# Patient Record
Sex: Female | Born: 1995 | Hispanic: No | Marital: Single | Smoking: Never smoker
Health system: Southern US, Community
[De-identification: ages and names within clinical notes are randomized; demographics above are authoritative.]

## PROBLEM LIST (undated history)

## (undated) HISTORY — PX: TONSILLECTOMY: SUR1361

---
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
## 2015-08-22 ENCOUNTER — Emergency Department (HOSPITAL_COMMUNITY)
Admission: EM | Admit: 2015-08-22 | Discharge: 2015-08-23 | Disposition: A | Discharge status: Home / Self Care | Payer: BLUE CROSS/BLUE SHIELD | Attending: Emergency Medicine | Admitting: Emergency Medicine

## 2015-08-22 ENCOUNTER — Encounter (HOSPITAL_COMMUNITY): Payer: Self-pay | Admitting: Emergency Medicine

## 2015-08-22 DIAGNOSIS — X58XXXA Exposure to other specified factors, initial encounter: Secondary | ICD-10-CM | POA: Insufficient documentation

## 2015-08-22 DIAGNOSIS — Y9389 Activity, other specified: Secondary | ICD-10-CM | POA: Diagnosis not present

## 2015-08-22 DIAGNOSIS — Y998 Other external cause status: Secondary | ICD-10-CM | POA: Diagnosis not present

## 2015-08-22 DIAGNOSIS — T782XXA Anaphylactic shock, unspecified, initial encounter: Secondary | ICD-10-CM

## 2015-08-22 DIAGNOSIS — R112 Nausea with vomiting, unspecified: Secondary | ICD-10-CM | POA: Diagnosis present

## 2015-08-22 DIAGNOSIS — L509 Urticaria, unspecified: Secondary | ICD-10-CM | POA: Insufficient documentation

## 2015-08-22 DIAGNOSIS — Y9289 Other specified places as the place of occurrence of the external cause: Secondary | ICD-10-CM | POA: Diagnosis not present

## 2015-08-22 MED ORDER — SODIUM CHLORIDE 0.9 % IV BOLUS (SEPSIS)
1000.0000 mL | Freq: Once | INTRAVENOUS | Status: AC
  Administered 2015-08-22: 1000 mL via INTRAVENOUS

## 2015-08-22 MED ORDER — FAMOTIDINE IN NACL 20-0.9 MG/50ML-% IV SOLN
20.0000 mg | Freq: Once | INTRAVENOUS | Status: AC
  Administered 2015-08-22: 20 mg via INTRAVENOUS
  Filled 2015-08-22: qty 50

## 2015-08-22 NOTE — ED Provider Notes (Signed)
CSN: [id number]     Arrival date & time 08/22/15  2304 History   First MD Initiated Contact with Patient 08/22/15 2316     No chief complaint on file.    (Consider location/radiation/quality/duration/timing/severity/associated sxs/prior Treatment) HPI   Patient with no significant PMH presents to the ER by EMS after having an allergic reaction to an unknown substance. She ate dinner at [facility] and had some wings and then went home and took some Amoxicillin for the sinus infection that she has. She reports that she has taken Amoxicillin many times in the past with no adverse reactions. 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   She ate dinner at 2000 and at 2100 she started to feel hot, abdominal pain, short of breath, lip and tongue tingling, N/V, shaky, and redness to her entire body with itching. Pain was originally 7/10 and is not mild. EMS gave her Epi IM, 50 mcg Benadryl, 4 mg Zofran. She is feeling much better and says her symptoms have improved greatly. Stable vital signs on initial exam.  History reviewed. No pertinent past medical history. Past Surgical History  Procedure Laterality Date  . Tonsillectomy     No family history on file. Social History  Substance Use Topics  . Smoking status: Never Smoker   . Smokeless tobacco: None  . Alcohol Use: No   OB History    No data available     Review of Systems  Review of Systems All other systems negative except as documented in the HPI. All pertinent positives and negatives as reviewed in the HPI.   Allergies  Review of patient's allergies indicates no known allergies.  Home Medications   Prior to Admission medications   Medication Sig Start Date End Date Taking? Authorizing Provider  diphenhydrAMINE (BENADRYL) 25 MG tablet Take 1 tablet (25 mg total) by mouth every 6 (six) hours. 08/23/15   Gaynelle Pastrana Neva Seat, PA-C  EPINEPHrine 0.3 mg/0.3 mL IJ SOAJ injection Inject  0.3 mLs (0.3 mg total) into the muscle once. 08/23/15   Evelisse Szalkowski Neva Seat, PA-C  loratadine (CLARITIN) 10 MG tablet Take 1 tablet (10 mg total) by mouth daily. 08/23/15   Oskar Cretella Neva Seat, PA-C  predniSONE (DELTASONE) 10 MG tablet Take 2 tablets (20 mg total) by mouth daily. 08/23/15   Raphaella Larkin Neva Seat, PA-C   BP 109/65 mmHg  Pulse 78  Temp(Src) 98.1 F (36.7 C) (Oral)  Resp 20  SpO2 98%  LMP 08/18/2015 Physical Exam  Constitutional: She appears well-developed and well-nourished. No distress.  HENT:  Head: Normocephalic and atraumatic.  Right Ear: Tympanic membrane and ear canal normal.  Left Ear: Tympanic membrane and ear canal normal.  Nose: Nose normal.  Mouth/Throat: Uvula is midline, oropharynx is clear and moist and mucous membranes are normal.  No intra oral swelling  Eyes: EOM and lids are normal. Pupils are equal, round, and reactive to light. Right conjunctiva is injected. Left conjunctiva is injected.  Neck: Normal range of motion. Neck supple.  Cardiovascular: Normal rate and regular rhythm.   Pulmonary/Chest: Effort normal and breath sounds normal. No accessory muscle usage or stridor. No respiratory distress.  Abdominal: Soft. Bowel sounds are normal. There is no tenderness. There is no rigidity and no guarding.  No signs of abdominal distention  Musculoskeletal:  No LE swelling  Neurological: She is alert.  Acting at baseline  Skin: Skin is warm and dry. Rash (diffuse urticarial rash) noted.  Rash is urticarial.  Nursing note and vitals reviewed.   ED Course  Procedures (including critical care time) Labs Review Labs Reviewed - No data to display  Imaging Review No results found. I have personally reviewed and evaluated these images and lab results as part of my medical decision-making.   EKG Interpretation   Date/Time:  Thursday August 22 2015 23:08:27 EDT Ventricular Rate:  88 PR Interval:  143 QRS Duration: 82 QT Interval:  332 QTC Calculation: 402 R Axis:    89 Text Interpretation:  Sinus rhythm Biatrial enlargement No previous  tracing Confirmed by BEATON  MD, ROBERT ([id number]) on 08/22/2015 11:51:38 PM      MDM   Final diagnoses:  Anaphylactic reaction, initial encounter    Patient monitored for 3.5 hours in ED. All of her symptoms resolved. She no long her has any rash, N/V, abdominal pain, injection or the eyes, or itching. She is feeling much better. The patient has had an anaphylactic reaction that responded well to Epi, fluids, benadryl, Zofran, steroids and pepcid  rx for home: diphenhydrAMINE (BENADRYL) 25 MG tablet Take 1 tablet (25 mg total) by mouth every 6 (six) hours. 20 tablet Marlon Peliffany Harless Molinari, PA-C    EPINEPHrine 0.3 mg/0.3 mL IJ SOAJ injection Inject 0.3 mLs (0.3 mg total) into the muscle once. 1 Device Maytal Mijangos, PA-C   loratadine (CLARITIN) 10 MG tablet Take 1 tablet (10 mg total) by mouth daily. 14 tablet Myrella Fahs, PA-C   ondansetron (ZOFRAN) 4 MG tablet Take 1 tablet (4 mg total) by mouth every 6 (six) hours. 12 tablet Marlon Peliffany Valena Ivanov, PA-C   predniSONE (DELTASONE) 10 MG tablet Take 2 tablets (20 mg total) by mouth daily.      Return precautions given. Patient to f/u with PCP to discuss allergy testing. At this time I  Have voiced caution with taking anymore of her Amoxicillin although I doubt this is the culprit because the reaction happened so fast. Will have her discuss with her PCP.  Medications  sodium chloride 0.9 % bolus 1,000 mL (0 mLs Intravenous Stopped 08/23/15 0118)  famotidine (PEPCID) IVPB 20 mg premix (0 mg Intravenous Stopped 08/23/15 0118)     I feel the patient has had an appropriate workup for their chief complaint at this time and likelihood of emergent condition existing is low. Discussed s/sx that warrant return to the ED.  Filed Vitals:   08/23/15 0200 08/23/15 0215  BP: 111/73 109/65  Pulse: 91 78  Temp:    Resp: 16 [address]43 [address]20     Jahziah Simonin, PA-C 08/23/15 0234  Nelva Nayobert Beaton,  MD 08/24/15 1105

## 2015-08-22 NOTE — ED Notes (Signed)
Patient comes from home states she was out to eat at 2000. Around 2100 patient states she started feeling Hot, N/V shaky, and redness noted all over. Patient now complains of ABD pain 7/10 Patient given Epi IM by EMS 50mg  Benadryl  and 4mg  of Zofran. Patient alert and oriented on arrival denies any SOB. Patient noted. To have redness noted to bilateral feet, face, sclera, and chest.

## 2015-08-23 MED ORDER — DIPHENHYDRAMINE HCL 25 MG PO TABS: 25.0000 mg | ORAL_TABLET | Freq: Four times a day (QID) | ORAL | Status: DC

## 2015-08-23 MED ORDER — LORATADINE 10 MG PO TABS: 10.0000 mg | ORAL_TABLET | Freq: Every day | ORAL | Status: DC

## 2015-08-23 MED ORDER — EPINEPHRINE 0.3 MG/0.3ML IJ SOAJ: 0.3000 mg | Freq: Once | INTRAMUSCULAR | Status: AC

## 2015-08-23 MED ORDER — PREDNISONE 20 MG PO TABS
60.0000 mg | ORAL_TABLET | Freq: Once | ORAL | Status: AC
  Administered 2015-08-23: 60 mg via ORAL
  Filled 2015-08-23: qty 3

## 2015-08-23 MED ORDER — METHYLPREDNISOLONE SODIUM SUCC 125 MG IJ SOLR: 125.0000 mg | Freq: Once | INTRAMUSCULAR | Status: DC

## 2015-08-23 MED ORDER — ONDANSETRON HCL 4 MG PO TABS: 4.0000 mg | ORAL_TABLET | Freq: Four times a day (QID) | ORAL | Status: DC

## 2015-08-23 MED ORDER — PREDNISONE 10 MG PO TABS: 20.0000 mg | ORAL_TABLET | Freq: Every day | ORAL | Status: DC

## 2015-08-23 NOTE — Discharge Instructions (Signed)

## 2015-08-23 NOTE — ED Notes (Signed)
Patient given ice pack per request.

## 2018-08-22 ENCOUNTER — Other Ambulatory Visit: Payer: Self-pay

## 2018-08-22 ENCOUNTER — Emergency Department (HOSPITAL_BASED_OUTPATIENT_CLINIC_OR_DEPARTMENT_OTHER)
Admission: EM | Admit: 2018-08-22 | Discharge: 2018-08-22 | Disposition: A | Discharge status: Home / Self Care | Payer: Commercial Managed Care - PPO | Attending: Emergency Medicine | Admitting: Emergency Medicine

## 2018-08-22 ENCOUNTER — Encounter (HOSPITAL_BASED_OUTPATIENT_CLINIC_OR_DEPARTMENT_OTHER): Payer: Self-pay | Admitting: Emergency Medicine

## 2018-08-22 ENCOUNTER — Emergency Department (HOSPITAL_BASED_OUTPATIENT_CLINIC_OR_DEPARTMENT_OTHER): Payer: Commercial Managed Care - PPO

## 2018-08-22 DIAGNOSIS — Z79899 Other long term (current) drug therapy: Secondary | ICD-10-CM | POA: Diagnosis not present

## 2018-08-22 DIAGNOSIS — I82492 Acute embolism and thrombosis of other specified deep vein of left lower extremity: Secondary | ICD-10-CM | POA: Diagnosis not present

## 2018-08-22 DIAGNOSIS — I82402 Acute embolism and thrombosis of unspecified deep veins of left lower extremity: Secondary | ICD-10-CM

## 2018-08-22 DIAGNOSIS — I824Y2 Acute embolism and thrombosis of unspecified deep veins of left proximal lower extremity: Secondary | ICD-10-CM

## 2018-08-22 DIAGNOSIS — M79605 Pain in left leg: Secondary | ICD-10-CM | POA: Diagnosis present

## 2018-08-22 LAB — CBC WITH DIFFERENTIAL/PLATELET
Abs Immature Granulocytes: 0.06 10*3/uL (ref 0.00–0.07)
Basophils Absolute: 0.1 10*3/uL (ref 0.0–0.1)
Basophils Relative: 0 %
Eosinophils Absolute: 0 10*3/uL (ref 0.0–0.5)
Eosinophils Relative: 0 %
HCT: 40.7 % (ref 36.0–46.0)
Hemoglobin: 13.2 g/dL (ref 12.0–15.0)
Immature Granulocytes: 0 %
Lymphocytes Relative: 6 %
Lymphs Abs: 1 10*3/uL (ref 0.7–4.0)
MCH: 28 pg (ref 26.0–34.0)
MCHC: 32.4 g/dL (ref 30.0–36.0)
MCV: 86.2 fL (ref 80.0–100.0)
Monocytes Absolute: 0.9 10*3/uL (ref 0.1–1.0)
Monocytes Relative: 6 %
Neutro Abs: 14.5 10*3/uL — ABNORMAL HIGH (ref 1.7–7.7)
Neutrophils Relative %: 88 %
Platelets: 252 10*3/uL (ref 150–400)
RBC: 4.72 MIL/uL (ref 3.87–5.11)
RDW: 13.3 % (ref 11.5–15.5)
WBC: 16.5 10*3/uL — ABNORMAL HIGH (ref 4.0–10.5)
nRBC: 0 % (ref 0.0–0.2)

## 2018-08-22 LAB — BASIC METABOLIC PANEL
Anion gap: 11 (ref 5–15)
BUN: 12 mg/dL (ref 6–20)
CO2: 20 mmol/L — ABNORMAL LOW (ref 22–32)
Calcium: 9.4 mg/dL (ref 8.9–10.3)
Chloride: 102 mmol/L (ref 98–111)
Creatinine, Ser: 0.89 mg/dL (ref 0.44–1.00)
GFR calc Af Amer: >60 mL/min (ref >60)
GFR calc non Af Amer: >60 mL/min (ref >60)
Glucose, Bld: 123 mg/dL — ABNORMAL HIGH (ref 70–99)
Potassium: 3.8 mmol/L (ref 3.5–5.1)
Sodium: 133 mmol/L — ABNORMAL LOW (ref 135–145)

## 2018-08-22 LAB — PREGNANCY, URINE: Preg Test, Ur: NEGATIVE

## 2018-08-22 MED ORDER — HEPARIN (PORCINE) 25000 UT/250ML-% IV SOLN
1300.0000 [IU]/h | INTRAVENOUS | Status: DC
  Administered 2018-08-22: 12:00:00 1300 [IU]/h via INTRAVENOUS
  Filled 2018-08-22: qty 250

## 2018-08-22 MED ORDER — APIXABAN 5 MG PO TABS: 5.0000 mg | ORAL_TABLET | Freq: Two times a day (BID) | ORAL | 1 refills | Status: AC

## 2018-08-22 MED ORDER — APIXABAN 5 MG PO TABS
10.0000 mg | ORAL_TABLET | Freq: Two times a day (BID) | ORAL | Status: DC
  Administered 2018-08-22: 16:00:00 10 mg via ORAL
  Filled 2018-08-22: qty 2

## 2018-08-22 MED ORDER — HEPARIN BOLUS VIA INFUSION
4000.0000 [IU] | Freq: Once | INTRAVENOUS | Status: AC
  Administered 2018-08-22: 12:00:00 4000 [IU] via INTRAVENOUS

## 2018-08-22 MED ORDER — APIXABAN 5 MG PO TABS: 5.0000 mg | ORAL_TABLET | Freq: Two times a day (BID) | ORAL | Status: DC

## 2018-08-22 NOTE — ED Provider Notes (Signed)
[facility] Emergency Department Provider Note MRN:  [id number]  Arrival date & time: 08/22/18     Chief Complaint   Leg Pain   History of Present Illness   Sherri Walker is a 23 y.o. year-old female with a history of tonsillectomy presenting to the ED with chief complaint of leg pain.  Sudden onset left leg pain that began at 4 AM this morning, constant, moderate to severe, sent here from med [facility] for evaluation.  Denies chest pain or shortness of breath, no exacerbating relieving factors.  Denies fever, no cough, no abdominal pain.  Pain is improved after Toradol.  Review of Systems  A complete 10 system review of systems was obtained and all systems are negative except as noted in the HPI and PMH.   Patient's Health History   History reviewed. No pertinent past medical history.  Past Surgical History:  Procedure Laterality Date  . TONSILLECTOMY      No family history on file.  Social History   Socioeconomic History  . Marital status: Single    Spouse name: Not on file  . Number of children: Not on file  . Years of education: Not on file  . Highest education level: Not on file  Occupational History  . Not on file  Social Needs  . Financial resource strain: Not on file  . Food insecurity:    Worry: Not on file    Inability: Not on file  . Transportation needs:    Medical: Not on file    Non-medical: Not on file  Tobacco Use  . Smoking status: Never Smoker  . Smokeless tobacco: Never Used  Substance and Sexual Activity  . Alcohol use: No  . Drug use: No  . Sexual activity: Not on file  Lifestyle  . Physical activity:    Days per week: Not on file    Minutes per session: Not on file  . Stress: Not on file  Relationships  . Social connections:    Talks on phone: Not on file    Gets together: Not on file    Attends religious service: Not on file    Active member of club or organization: Not on file    Attends meetings of clubs  or organizations: Not on file    Relationship status: Not on file  . Intimate partner violence:    Fear of current or ex partner: Not on file    Emotionally abused: Not on file    Physically abused: Not on file    Forced sexual activity: Not on file  Other Topics Concern  . Not on file  Social History Narrative  . Not on file     Physical Exam  Vital Signs and Nursing Notes reviewed Vitals:   08/22/18 1600 08/22/18 1630  BP: (!) 101/53 112/85  Pulse: 84   Resp: 18 19  Temp:    SpO2: 96%     CONSTITUTIONAL: Well-appearing, NAD NEURO:  Alert and oriented x 3, no focal deficits EYES:  eyes equal and reactive ENT/NECK:  no LAD, no JVD CARDIO: Regular rate, well-perfused, normal S1 and S2 PULM:  CTAB no wheezing or rhonchi GI/GU:  normal bowel sounds, non-distended, non-tender MSK/SPINE:  No gross deformities, no edema SKIN:  no rash, atraumatic PSYCH:  Appropriate speech and behavior  Diagnostic and Interventional Summary    Labs Reviewed  BASIC METABOLIC PANEL - Abnormal; Notable for the following components:      Result  Value   Sodium 133 (*)    CO2 20 (*)    Glucose, Bld 123 (*)    All other components within normal limits  CBC WITH DIFFERENTIAL/PLATELET - Abnormal; Notable for the following components:   WBC 16.5 (*)    Neutro Abs 14.5 (*)    All other components within normal limits  PREGNANCY, URINE  CBC    US Venous Img Lower Unilateral Left  Final Result      Medications  apixaban (ELIQUIS) tablet 10 mg (10 mg Oral Given 08/22/18 1612)    Followed by  apixaban (ELIQUIS) tablet 5 mg (has no administration in time range)  heparin bolus via infusion 4,000 Units (4,000 Units Intravenous Bolus from Bag 08/22/18 1217)     Procedures Critical Care  ED Course and Medical Decision Making  I have reviewed the triage vital signs and the nursing notes.  Pertinent labs & imaging results that were available during my care of the patient were reviewed by me and  considered in my medical decision making (see below for details).  Evaluated by Dr. Darrick PennaFields given the concern at [facility] for phlegmasia cerulea dolens.  Patient's greatly improved based on the description of her pain at [facility].  Currently without pain after Toradol, no objective difference in the clinical appearance both of her legs, neurovascularly intact distally.  Ultrasound does reveal DVT.  Per vascular recommendations, will start oral anticoagulation, pharmacy consulted for counseling, will follow-up with Dr. Darrick Pennafields as needed.  After the discussed management above, the patient was determined to be safe for discharge.  The patient was in agreement with this plan and all questions regarding their care were answered.  ED return precautions were discussed and the patient will return to the ED with any significant worsening of condition.  Elmer SowMichael M. Pilar PlateBero, MD [facility] [email or web address]  Final Clinical Impressions(s) / ED Diagnoses     ICD-10-CM   1. Acute deep vein thrombosis (DVT) of proximal vein of left lower extremity (HCC) I82.4Y2     ED Discharge Orders         Ordered    apixaban (ELIQUIS) 5 MG TABS tablet  2 times daily     08/22/18 1632             Sherri SousBero, Dimetrius Montfort M, MD 08/22/18 [phone]

## 2018-08-22 NOTE — ED Notes (Signed)
Vascular team at bedside

## 2018-08-22 NOTE — ED Notes (Signed)
Pharmacy at bedside, RN to stop heparin upon eliquis arrival.

## 2018-08-22 NOTE — ED Notes (Addendum)
ED Provider at bedside discussing test results and dispo plan of care for admission to [facility].

## 2018-08-22 NOTE — Discharge Instructions (Addendum)
Please call the vascular surgery clinic provided to set up a follow-up appointment.  Information on my medicine - ELIQUIS (apixaban)  This medication education was reviewed with me or my healthcare representative as part of my discharge preparation.  WHY WAS ELIQUIS PRESCRIBED FOR YOU? Eliquis was prescribed to treat blood clots that may have been found in the veins of your legs (deep vein thrombosis) or in your lungs (pulmonary embolism) and to reduce the risk of them occurring again.  WHAT DO YOU NEED TO KNOW ABOUT ELIQUIS ? The starting dose is 10 mg (two 5 mg tablets) taken TWICE daily for the FIRST SEVEN (7) DAYS, then the dose is reduced to ONE 5 mg tablet taken TWICE daily.  Eliquis may be taken with or without food.   Try to take the dose about the same time in the morning and in the evening. If you have difficulty swallowing the tablet whole please discuss with your pharmacist how to take the medication safely.  Take Eliquis exactly as prescribed and DO NOT stop taking Eliquis without talking to the doctor who prescribed the medication.  Stopping may increase your risk of developing a new blood clot.  Refill your prescription before you run out.  After discharge, you should have regular check-up appointments with your healthcare provider that is prescribing your Eliquis.    WHAT DO YOU DO IF YOU MISS A DOSE? If a dose of ELIQUIS is not taken at the scheduled time, take it as soon as possible on the same day and twice-daily administration should be resumed. The dose should not be doubled to make up for a missed dose.  IMPORTANT SAFETY INFORMATION A possible side effect of Eliquis is bleeding. You should call your healthcare provider right away if you experience any of the following: Bleeding from an injury or your nose that does not stop. Unusual colored urine (red or dark brown) or unusual colored stools (red or black). Unusual bruising for unknown reasons. A serious fall  or if you hit your head (even if there is no bleeding).  Some medicines may interact with Eliquis and might increase your risk of bleeding or clotting while on Eliquis. To help avoid this, consult your healthcare provider or pharmacist prior to using any new prescription or non-prescription medications, including herbals, vitamins, non-steroidal anti-inflammatory drugs (NSAIDs) and supplements.  This website has more information on Eliquis (apixaban): http://www.eliquis.com/eliquis/home

## 2018-08-22 NOTE — ED Notes (Signed)
Patient transported to Ultrasound 

## 2018-08-22 NOTE — ED Notes (Signed)
ED Provider at bedside. 

## 2018-08-22 NOTE — ED Provider Notes (Signed)
[facility] EMERGENCY DEPARTMENT Provider Note   CSN: [id number] Arrival date & time: 08/22/18  6381    History   Chief Complaint Chief Complaint  Patient presents with  . Leg Pain    HPI Sherri Walker is a 23 y.o. female.     HPI  23 year old female presents with leg pain.  Over the last 2 days she has been having low back pain.  She has not taken anything for it.  This morning around 4 AM she developed sudden left lower extremity pain.  She feels like her leg is a little swollen and has noticed it is discolored and appears blue.  The pain primarily was in her thigh though also down the rest of her leg.  It feels like it shot down from her back.  Went to urgent care and was given Toradol which has made her discomfort much better but she still has some pain.  The leg feels a little weaker than the right.  She is able to walk but it is painful. No chest pain or shortness of breath.  History reviewed. No pertinent past medical history.  There are no active problems to display for this patient.   Past Surgical History:  Procedure Laterality Date  . TONSILLECTOMY       OB History   No obstetric history on file.      Home Medications    Prior to Admission medications   Medication Sig Start Date End Date Taking? Authorizing Provider  EPINEPHrine 0.3 mg/0.3 mL IJ SOAJ injection Inject 0.3 mLs (0.3 mg total) into the muscle once. 08/23/15   Marlon Pel, PA-C  JUNEL FE 1/20 1-20 MG-MCG tablet TK 1 T PO AT THE SAME TIME QD 07/23/18   [provider]    Family History No family history on file.  Social History Social History   Tobacco Use  . Smoking status: Never Smoker  . Smokeless tobacco: Never Used  Substance Use Topics  . Alcohol use: No  . Drug use: No     Allergies   Amoxicillin   Review of Systems Review of Systems  Constitutional: Negative for fever.  Respiratory: Negative for shortness of breath.   Cardiovascular: Positive for  leg swelling. Negative for chest pain.  Musculoskeletal: Positive for back pain and myalgias.  Neurological: Positive for weakness. Negative for numbness.  All other systems reviewed and are negative.    Physical Exam Updated Vital Signs BP 113/72 (BP Location: Right Arm)   Pulse 84   Temp 98.5 F (36.9 C) (Oral)   Resp 16   Ht 5\' 7"  (1.702 m)   Wt 86.2 kg   LMP 08/22/2018   SpO2 98%   BMI 29.76 kg/m   Physical Exam Vitals signs and nursing note reviewed.  Constitutional:      Appearance: She is well-developed.  HENT:     Head: Normocephalic and atraumatic.     Right Ear: External ear normal.     Left Ear: External ear normal.     Nose: Nose normal.  Eyes:     General:        Right eye: No discharge.        Left eye: No discharge.  Cardiovascular:     Rate and Rhythm: Normal rate and regular rhythm.     Pulses:          Dorsalis pedis pulses are 2+ on the right side and 2+ on the left side.  Pulmonary:  Effort: Pulmonary effort is normal.  Abdominal:     General: There is no distension.     Tenderness: There is no abdominal tenderness.  Musculoskeletal:     Lumbar back: She exhibits tenderness.     Comments: Left lower extremity is diffusely darker and mildly swollen compared to RLE. Strength is 5/5 in RLE. She cannot lift it as high off stretcher in LLE but resists downward pressure without difficulty. Somewhat painful. Normal sensation.  Skin:    General: Skin is warm and dry.  Neurological:     Mental Status: She is alert.  Psychiatric:        Mood and Affect: Mood is not anxious.        ED Treatments / Results  Labs (all labs ordered are listed, but only abnormal results are displayed) Labs Reviewed  BASIC METABOLIC PANEL - Abnormal; Notable for the following components:      Result Value   Sodium 133 (*)    CO2 20 (*)    Glucose, Bld 123 (*)    All other components within normal limits  CBC WITH DIFFERENTIAL/PLATELET - Abnormal; Notable for  the following components:   WBC 16.5 (*)    Neutro Abs 14.5 (*)    All other components within normal limits  PREGNANCY, URINE    EKG None  Radiology US Venous Img Lower Unilateral Left  Result Date: 08/22/2018 CLINICAL DATA:  Left lower extremity pain for 3 days EXAM: LEFT LOWER EXTREMITY VENOUS DUPLEX ULTRASOUND TECHNIQUE: Doppler venous assessment of the left lower extremity deep venous system was performed, including characterization of spectral flow, compressibility, and phasicity. COMPARISON:  None. FINDINGS: There is non compressible occlusive thrombus throughout the left common femoral, femoral, and popliteal veins. There is no obvious superficial vein thrombosis. There is no evidence of DVT in the calf. IMPRESSION: There is extensive DVT throughout the left common femoral, femoral, and popliteal veins. Critical Value/emergent results were called by telephone at the time of interpretation on 08/22/2018 at 11:42 am to Dr. Pricilla Loveless , who verbally acknowledged these results. Electronically Signed   By: Jolaine Click M.D.   On: 08/22/2018 11:42    Procedures .Critical Care Performed by: Pricilla Loveless, MD Authorized by: Pricilla Loveless, MD   Critical care provider statement:    Critical care time (minutes):  30   Critical care time was exclusive of:  Separately billable procedures and treating other patients   Critical care was necessary to treat or prevent imminent or life-threatening deterioration of the following conditions: limb ischemia.   Critical care was time spent personally by me on the following activities:  Development of treatment plan with patient or surrogate, discussions with consultants, evaluation of patient's response to treatment, examination of patient, obtaining history from patient or surrogate, ordering and performing treatments and interventions, ordering and review of laboratory studies, ordering and review of radiographic studies, pulse oximetry,  re-evaluation of patient's condition and review of old charts   (including critical care time)  Medications Ordered in ED Medications  heparin bolus via infusion 4,000 Units (has no administration in time range)  heparin ADULT infusion 100 units/mL (25000 units/210mL sodium chloride 0.45%) (has no administration in time range)     Initial Impression / Assessment and Plan / ED Course  I have reviewed the triage vital signs and the nursing notes.  Pertinent labs & imaging results that were available during my care of the patient were reviewed by me and considered in my medical decision  making (see chart for details).        Patient has strong pulses in both extremities but with the purplish skin color in association with the large DVT this is consistent with phlegmasia cerulea dolens.  No chest symptoms to suggest PE.  She has been started on IV heparin.  I consulted with Dr. Darrick PennaFields of vascular surgery who [facility]  Dr. Patria Maneampos, EDP, aware.  Will keep n.p.o. With rest her leg color has improved in the ED.  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

## 2018-08-22 NOTE — Progress Notes (Signed)
ANTICOAGULATION CONSULT NOTE  Pharmacy Consult for heparin>>apixaban Indication: DVT   Patient Measurements: Height: 5\' 7"  (170.2 cm) Weight: 190 lb (86.2 kg) IBW/kg (Calculated) : 61.6 Heparin Dosing Weight: 80 kg  Vital Signs: Temp: 99 F (37.2 C) (04/20 1432) Temp Source: Oral (04/20 1432) BP: 101/53 (04/20 1600) Pulse Rate: 84 (04/20 1600)  Labs: Recent Labs    08/22/18 1046  HGB 13.2  HCT 40.7  PLT 252  CREATININE 0.89      Assessment: 23 yo female with leg pain. Found to have extensive DVT. Started on IV heparin but now transitioning to apixaban.   Goal of Therapy:  Monitor platelets by anticoagulation protocol: Yes   Plan:  DC heparin Start apixaban 10mg  PO BID x 7 days then 5mg  PO BID thereafter Pt educated on apixaban use  Sherri Walker, Sherri Walker 08/22/2018,4:12 PM

## 2018-08-22 NOTE — Consult Note (Addendum)
Referring Physician: [facility]  Patient name: Sherri Walker MRN: [id number] DOB: 1996/01/21 Sex: female  REASON FOR CONSULT: rule out Phlegmasia Cerulea Dolens  HPI: Sherri ImusOla K Shilling is a 23 y.o. female, with a 1 day history of enlarging swollen painful left leg.  No prior DVT.  No history of hypercoagulable state.  She does not smoke.  She is on oral contraceptives started recently.  Pt was started on heparin drip [facility]  She states leg still feels heavy but feels better than earlier today.  No motor or sensory deficit.   History reviewed. No pertinent past medical history. Past Surgical History:  Procedure Laterality Date  . TONSILLECTOMY      No family history on file.  SOCIAL HISTORY: Social History   Socioeconomic History  . Marital status: Single    Spouse name: Not on file  . Number of children: Not on file  . Years of education: Not on file  . Highest education level: Not on file  Occupational History  . Not on file  Social Needs  . Financial resource strain: Not on file  . Food insecurity:    Worry: Not on file    Inability: Not on file  . Transportation needs:    Medical: Not on file    Non-medical: Not on file  Tobacco Use  . Smoking status: Never Smoker  . Smokeless tobacco: Never Used  Substance and Sexual Activity  . Alcohol use: No  . Drug use: No  . Sexual activity: Not on file  Lifestyle  . Physical activity:    Days per week: Not on file    Minutes per session: Not on file  . Stress: Not on file  Relationships  . Social connections:    Talks on phone: Not on file    Gets together: Not on file    Attends religious service: Not on file    Active member of club or organization: Not on file    Attends meetings of clubs or organizations: Not on file    Relationship status: Not on file  . Intimate partner violence:    Fear of current or ex partner: Not on file    Emotionally abused:  Not on file    Physically abused: Not on file    Forced sexual activity: Not on file  Other Topics Concern  . Not on file  Social History Narrative  . Not on file    Allergies  Allergen Reactions  . Amoxicillin Anaphylaxis    Current Facility-Administered Medications  Medication Dose Route Frequency Provider Last Rate Last Dose  . heparin ADULT infusion 100 units/mL (25000 units/24150mL sodium chloride 0.45%)  1,300 Units/hr Intravenous Continuous Baldemar FridayMasters, Alison M, [facility] 13 mL/hr at 08/22/18 1216 1,300 Units/hr at 08/22/18 1216   Current Outpatient Medications  Medication Sig Dispense Refill  . EPINEPHrine 0.3 mg/0.3 mL IJ SOAJ injection Inject 0.3 mLs (0.3 mg total) into the muscle once. 1 Device 0  . JUNEL FE 1/20 1-20 MG-MCG tablet TK 1 T PO AT THE SAME TIME QD      ROS:   General:  No weight loss, Fever, chills  HEENT: No recent headaches, no nasal bleeding, no visual changes, no sore throat  Neurologic: No dizziness, blackouts, seizures. No recent symptoms of stroke or mini- stroke. No recent episodes of slurred speech, or temporary blindness.  Cardiac: No recent episodes of chest pain/pressure, no shortness of  breath at rest.  No shortness of breath with exertion.  Denies history of atrial fibrillation or irregular heartbeat  Vascular: No history of rest pain in feet.  No history of claudication.  No history of non-healing ulcer, No history of DVT   Pulmonary: No home oxygen, no productive cough, no hemoptysis,  No asthma or wheezing  Musculoskeletal:  [ ]  Arthritis, [ ]  Low back pain,  [ ]  Joint pain  Hematologic:No history of hypercoagulable state.  No history of easy bleeding.  No history of anemia  Gastrointestinal: No hematochezia or melena,  No gastroesophageal reflux, no trouble swallowing  Urinary: [ ]  chronic Kidney disease, [ ]  on HD - [ ]  MWF or [ ]  TTHS, [ ]  Burning with urination, [ ]  Frequent urination, [ ]  Difficulty urinating;   Skin: No rashes   Psychological: No history of anxiety,  No history of depression   Physical Examination  Vitals:   08/22/18 0924 08/22/18 0925 08/22/18 1141 08/22/18 1432  BP: 129/87  113/72 (!) 145/108  Pulse: 95  84 90  Resp: 16  16 16   Temp: 98.5 F (36.9 C)   99 F (37.2 C)  TempSrc: Oral   Oral  SpO2: 96%  98% 99%  Weight:  86.2 kg    Height:  5\' 7"  (1.702 m)      Body mass index is 29.76 kg/m.  General:  Alert and oriented, no acute distress HEENT: Normal Neck: No JVD Cardiac: Regular Rate and Rhythm Skin: No rash, pink warm brisk cap refill toes bilaterally symmetric Extremity Pulses:  2+ radial, brachial, femoral, dorsalis pedis pulses bilaterally Musculoskeletal: No deformity left leg with some edema approximately 5-10 percent larger than right, not tense no evidence of compartment syndrome Neurologic: Upper and lower extremity motor 5/5 and symmetric  DATA:  DVT left common femoral popliteal vein  ASSESSMENT:  Left leg DVT most likely related to oral contraceptive use  PLAN:  1. Would continue IV heparin.  If has persistant or recurrent event down the road would consider further investigation for May-thurner.  At this point minimal symptoms would treat with stopping oral contraceptives and 3 months of oral anticoagulation.  Consider hypercoag workup if future events  No indication for venous thrombolysis at this point.  No follow up from my standpoint  Call if questions   Fabienne Bruns, MD [facility] [place] Office: [phone] Pager: [phone]  Addendum: Called by patient's former primary care physician on August 25, 2018.  This was Dr. Loyola Mast.  She had received a copy of my note regarding the patient.  She has not seen the patient for over 1 year.  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  She also has a family history of this.  In light of this consideration will need to  be given for lifelong anticoagulation.  I discussed this with Dr. Rana Snare.  She is going to try to arrange for the patient to make sure she has follow-up with a primary care physician to follow this long-term.  Fabienne Bruns, MD [facility] [place] Office: [phone] Pager: [phone]

## 2018-08-22 NOTE — Progress Notes (Signed)
ANTICOAGULATION CONSULT NOTE - Initial Consult  Pharmacy Consult for heparin Indication: DVT   Patient Measurements: Height: 5\' 7"  (170.2 cm) Weight: 190 lb (86.2 kg) IBW/kg (Calculated) : 61.6 Heparin Dosing Weight: 80 kg  Vital Signs: Temp: 98.5 F (36.9 C) (04/20 0924) Temp Source: Oral (04/20 0924) BP: 113/72 (04/20 1141) Pulse Rate: 84 (04/20 1141)  Labs: Recent Labs    08/22/18 1046  HGB 13.2  HCT 40.7  PLT 252  CREATININE 0.89      Assessment: 23 yo female with leg pain. Found to have extensive DVT. Starting heparin infusion. CBC and SCr stable and wnl. Dopplers: IMPRESSION: There is extensive DVT throughout the left common femoral, femoral, and popliteal veins.    Goal of Therapy:  Heparin level 0.3-0.7 units/ml Monitor platelets by anticoagulation protocol: Yes   Plan:  -Heparin bolus 4000 units x1 then 1300 units/hr -Daily HL, CBC -Level tonight   Sherri Walker 08/22/2018,11:49 AM

## 2018-08-22 NOTE — ED Notes (Signed)
Pt arrives via [facility], pt denies current pain in LLE, but reports it did start at 0400 this am. Pt comfortable at this time. Will continue to monitor.

## 2018-08-22 NOTE — ED Triage Notes (Signed)
Pain in left low back 2 days ago without known injury and no hx of back pain.  This morning the pain was in her entire left leg but worse in upper leg.  Went to UC and they referred her to ED to r/o blood clot. Received Toradol IM with marked improvement in pain.

## 2018-08-26 ENCOUNTER — Telehealth: Payer: Self-pay | Admitting: *Deleted

## 2018-08-26 ENCOUNTER — Other Ambulatory Visit: Payer: Self-pay | Admitting: Vascular Surgery

## 2018-08-26 MED ORDER — OXYCODONE-ACETAMINOPHEN 5-325 MG PO TABS: 1.0000 | ORAL_TABLET | Freq: Four times a day (QID) | ORAL | 0 refills | Status: AC | PRN

## 2018-08-26 NOTE — Telephone Encounter (Signed)
Patient called c/o leg pain 9/10. No relief with Tylenol.  Denies any swelling or discoloration, leg warm and equal temperature. She is taking Eliquis. Rx for Percocet sent to pharmacy by Dr. Randie Heinz. Patient instructed to take for severe pain only and take the least amount to get relief of pain. Go to [facility] ER for any acute or worsening condition. Verbalized understanding.

## 2019-08-23 IMAGING — US VENOUS DOPPLER ULTRASOUND OF LEFT LOWER EXTREMITY
1 series · 14 of 24 positions shown · non-contrast
Comparison: None.

CLINICAL DATA: Left lower extremity pain for 3 days

EXAM:
LEFT LOWER EXTREMITY VENOUS DUPLEX ULTRASOUND
TECHNIQUE: Doppler venous assessment of the left lower extremity deep venous
system was performed, including characterization of spectral flow,
compressibility, and phasicity.

[Series 1: venous doppler ultrasound of left lower extremity · 38 acquisitions, 14 frames shown]
[im 1/38]
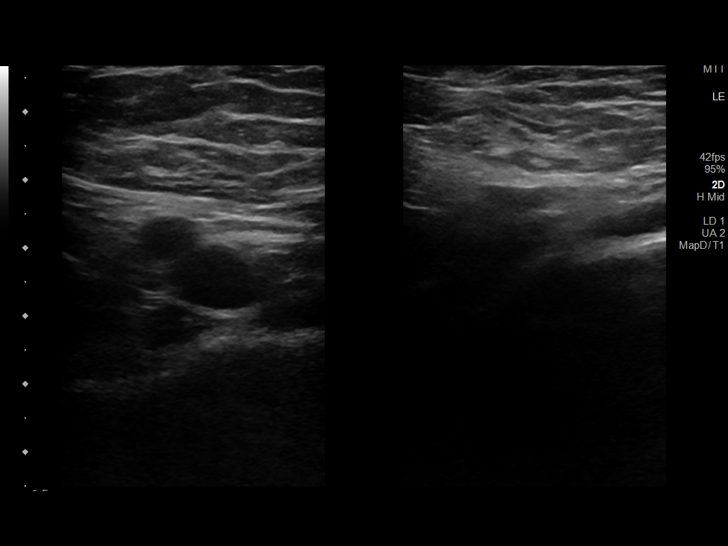
[im 4/38]
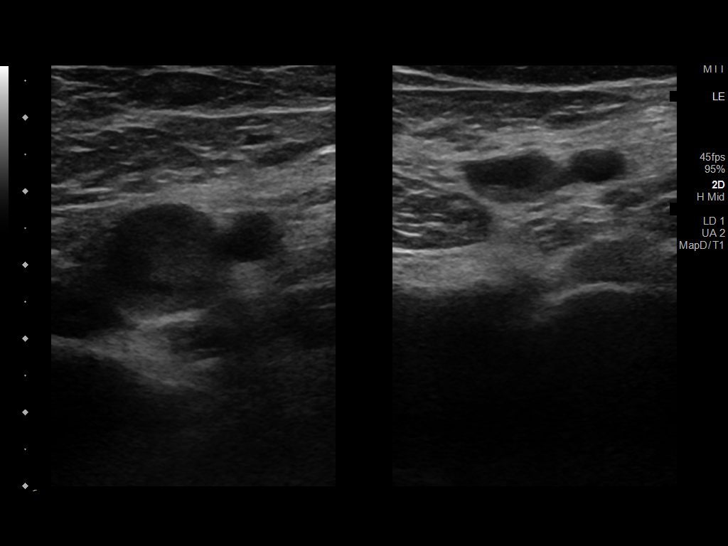
[im 7/38]
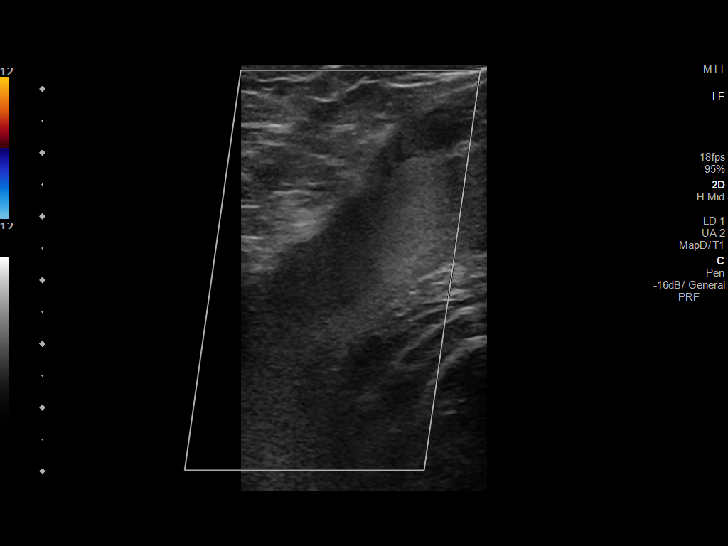
[im 12/38]
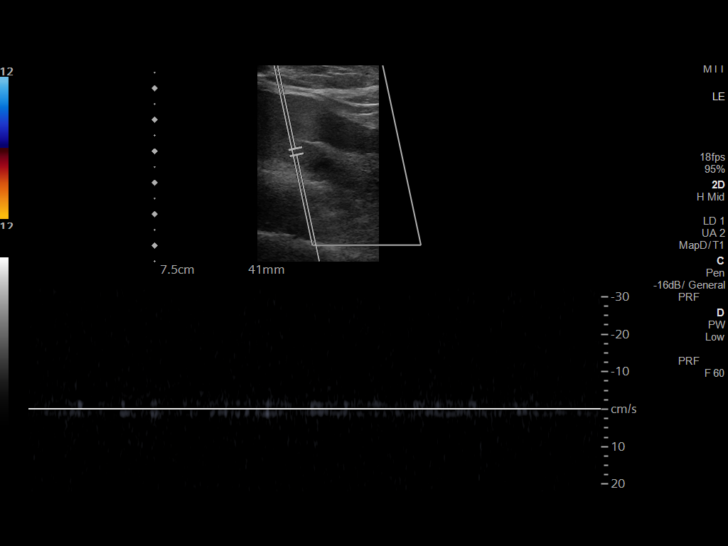
[im 13/38]
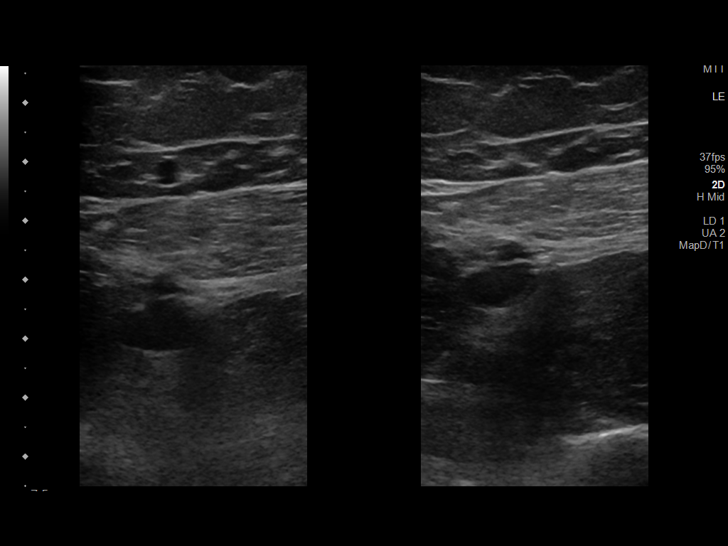
[im 17/38]
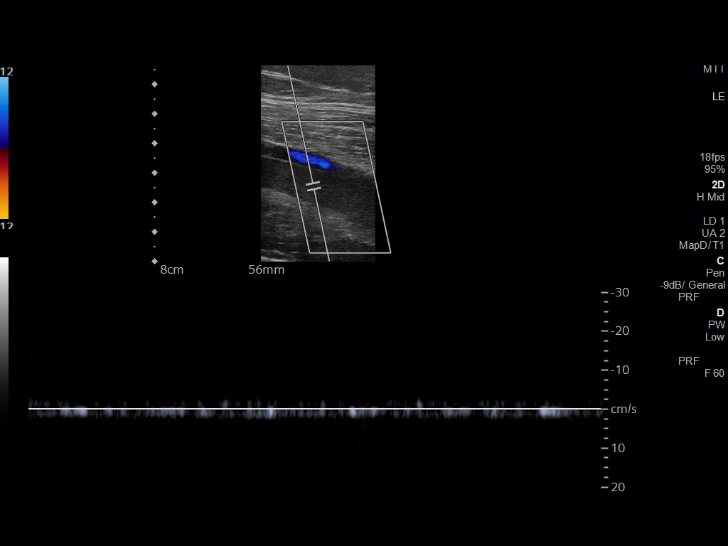
[im 20/38]
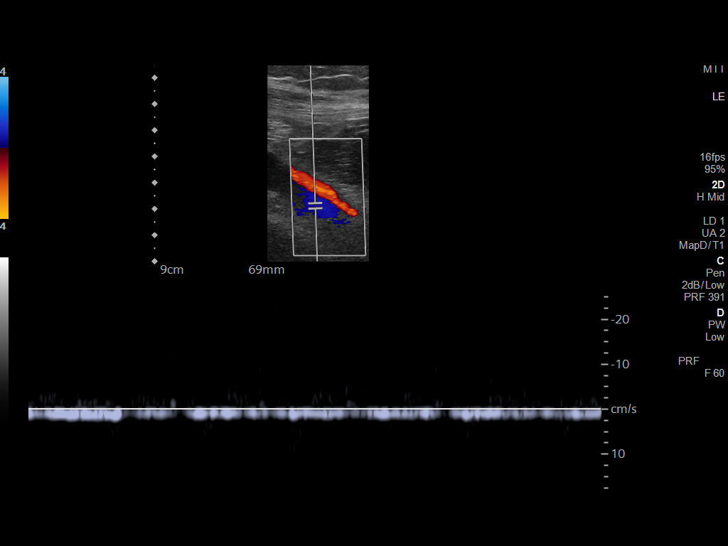
[im 21/38]
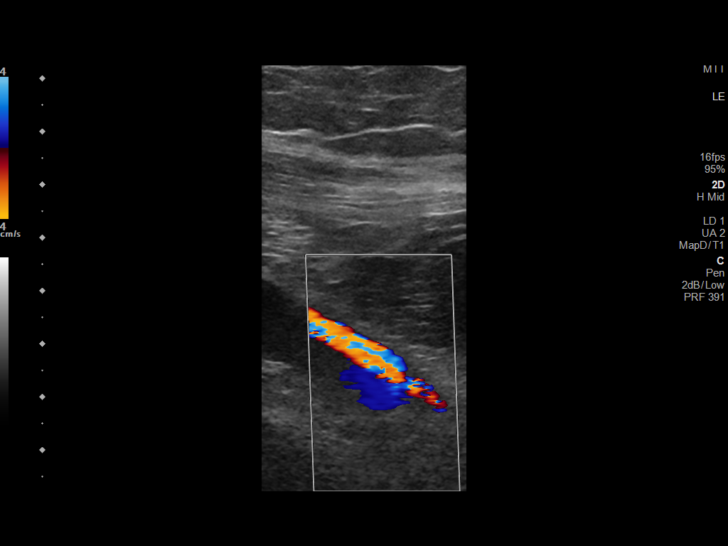
[im 25/38]
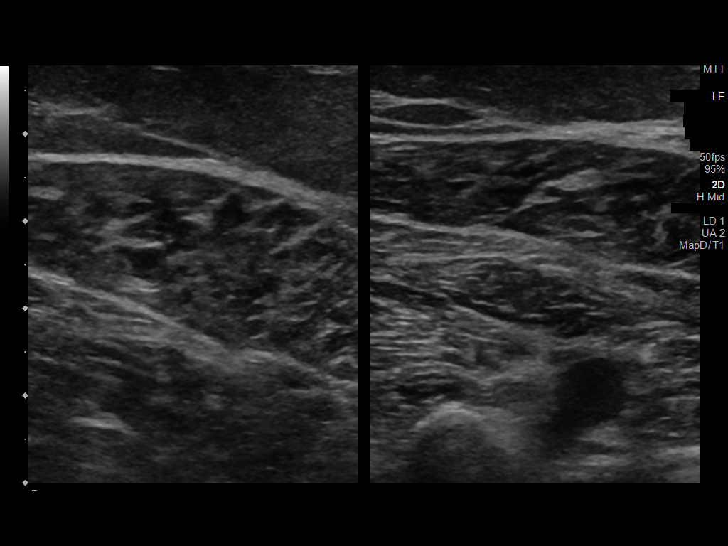
[im 28/38]
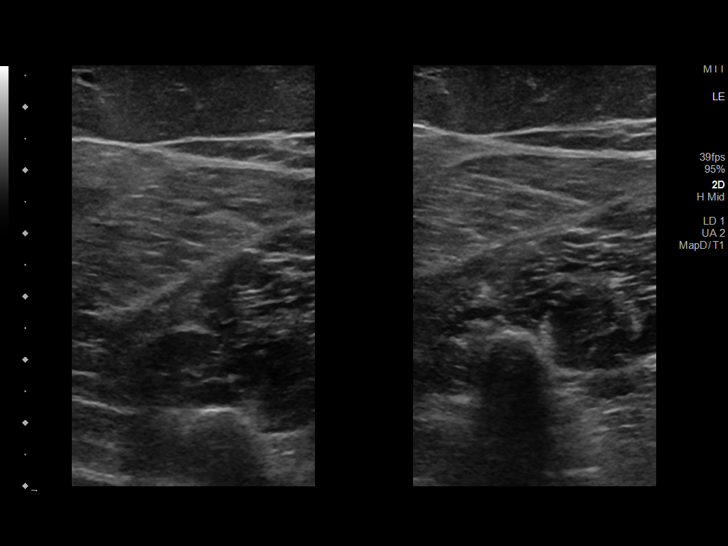
[im 33/38]
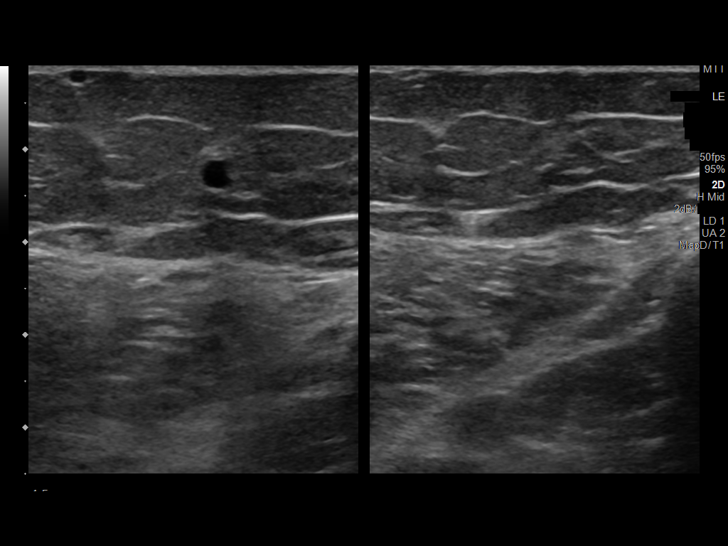
[im 34/38]
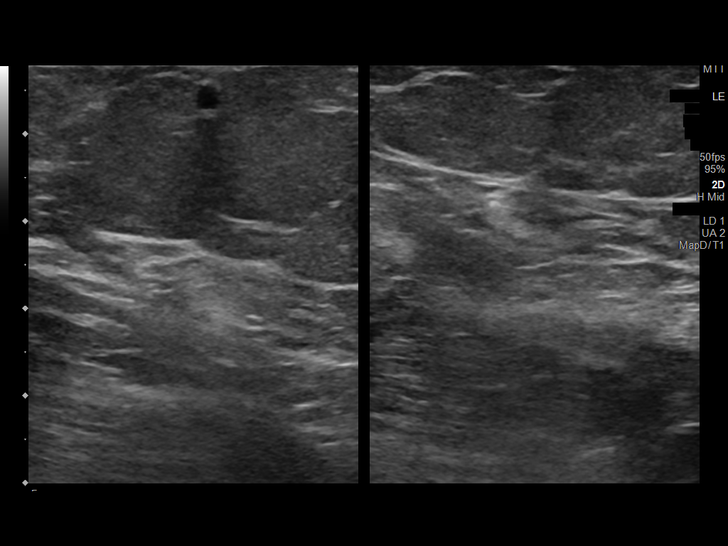
[im 36/38]
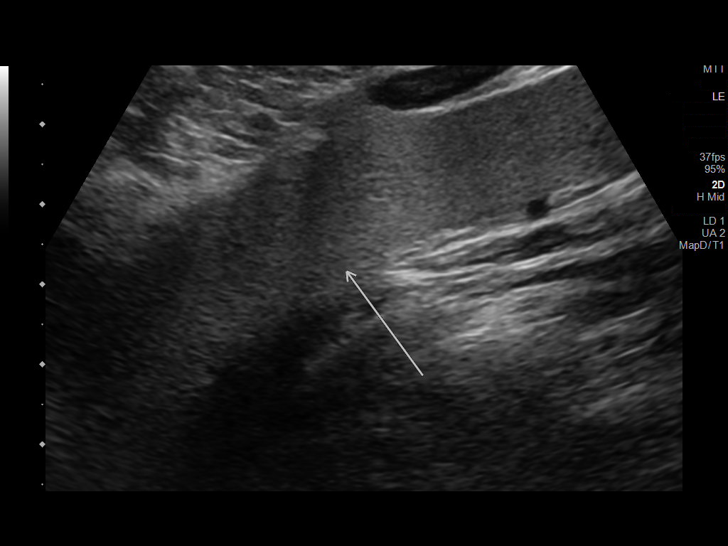
[im 38/38]
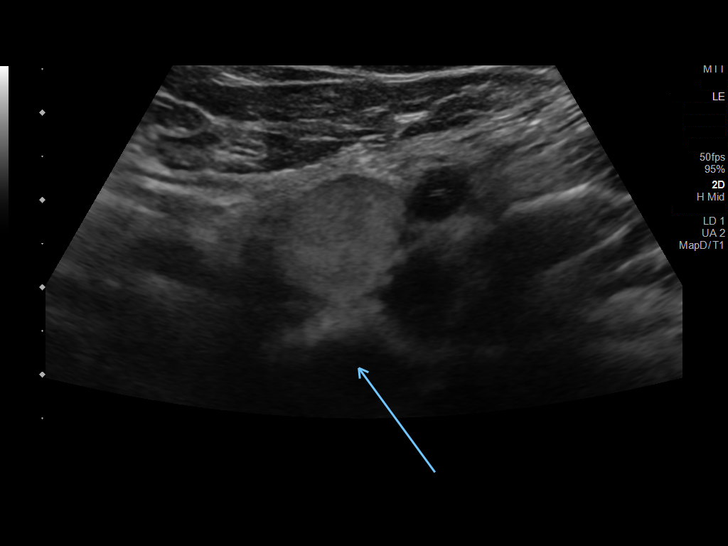

[14 of 24 positions shown; findings below may reference images not displayed]

FINDINGS: There is non compressible occlusive thrombus throughout the left
common femoral, femoral, and popliteal veins. There is no obvious
superficial vein thrombosis. There is no evidence of DVT in the
calf.
IMPRESSION: There is extensive DVT throughout the left common femoral, femoral,
and popliteal veins. Critical Value/emergent results were called by
Dr. DAVEY MEHDI , who verbally acknowledged these results.
